# Patient Record
Sex: Male | Born: 1980 | Race: White | Hispanic: No | Marital: Married | Smoking: Current every day smoker
Health system: Southern US, Community
[De-identification: ages and names within clinical notes are randomized; demographics above are authoritative.]

## PROBLEM LIST (undated history)

## (undated) DIAGNOSIS — F4310 Post-traumatic stress disorder, unspecified: Secondary | ICD-10-CM

## (undated) DIAGNOSIS — R000 Tachycardia, unspecified: Secondary | ICD-10-CM

## (undated) DIAGNOSIS — M549 Dorsalgia, unspecified: Secondary | ICD-10-CM

## (undated) HISTORY — PX: TONSILLECTOMY: SUR1361

## (undated) HISTORY — PX: APPENDECTOMY: SHX54

---

## 2011-09-24 ENCOUNTER — Emergency Department (HOSPITAL_COMMUNITY)

## 2011-09-24 ENCOUNTER — Other Ambulatory Visit: Payer: Self-pay

## 2011-09-24 ENCOUNTER — Encounter (HOSPITAL_COMMUNITY): Payer: Self-pay | Admitting: *Deleted

## 2011-09-24 ENCOUNTER — Emergency Department (HOSPITAL_COMMUNITY)
Admission: EM | Admit: 2011-09-24 | Discharge: 2011-09-25 | Disposition: A | Discharge status: Home / Self Care | Attending: Emergency Medicine | Admitting: Emergency Medicine

## 2011-09-24 DIAGNOSIS — R079 Chest pain, unspecified: Secondary | ICD-10-CM | POA: Insufficient documentation

## 2011-09-24 DIAGNOSIS — R0602 Shortness of breath: Secondary | ICD-10-CM | POA: Insufficient documentation

## 2011-09-24 DIAGNOSIS — F17200 Nicotine dependence, unspecified, uncomplicated: Secondary | ICD-10-CM | POA: Insufficient documentation

## 2011-09-24 DIAGNOSIS — R110 Nausea: Secondary | ICD-10-CM | POA: Insufficient documentation

## 2011-09-24 HISTORY — DX: Post-traumatic stress disorder, unspecified: F43.10

## 2011-09-24 HISTORY — DX: Dorsalgia, unspecified: M54.9

## 2011-09-24 HISTORY — DX: Tachycardia, unspecified: R00.0

## 2011-09-24 MED ORDER — LORAZEPAM 1 MG PO TABS
1.0000 mg | ORAL_TABLET | Freq: Once | ORAL | Status: AC
  Administered 2011-09-25: 1 mg via ORAL
  Filled 2011-09-24: qty 1

## 2011-09-24 MED ORDER — ASPIRIN 325 MG PO TABS
325.0000 mg | ORAL_TABLET | Freq: Once | ORAL | Status: AC
  Administered 2011-09-25: 325 mg via ORAL
  Filled 2011-09-24: qty 1

## 2011-09-24 MED ORDER — MORPHINE SULFATE 4 MG/ML IJ SOLN
4.0000 mg | Freq: Once | INTRAMUSCULAR | Status: AC
  Administered 2011-09-25: 4 mg via INTRAVENOUS
  Filled 2011-09-24: qty 1

## 2011-09-24 NOTE — ED Notes (Signed)
Patient transported to X-ray 

## 2011-09-24 NOTE — ED Notes (Signed)
Received pt. From check in with C/O chest pain, pt. Had 1 episode last night, this episode started approx. 2 hours ago

## 2011-09-25 LAB — POCT I-STAT TROPONIN I
Troponin i, poc: 0 ng/mL (ref 0.00–0.08)
Troponin i, poc: 0 ng/mL (ref 0.00–0.08)

## 2011-09-25 LAB — DIFFERENTIAL
Basophils Absolute: 0 10*3/uL (ref 0.0–0.1)
Basophils Relative: 1 % (ref 0–1)
Eosinophils Relative: 2 % (ref 0–5)
Monocytes Absolute: 0.9 10*3/uL (ref 0.1–1.0)

## 2011-09-25 LAB — COMPREHENSIVE METABOLIC PANEL
AST: 30 U/L (ref 0–37)
Albumin: 3.6 g/dL (ref 3.5–5.2)
Calcium: 8.9 mg/dL (ref 8.4–10.5)
Creatinine, Ser: 0.93 mg/dL (ref 0.50–1.35)
GFR calc non Af Amer: >90 mL/min (ref >90)

## 2011-09-25 LAB — D-DIMER, QUANTITATIVE: D-Dimer, Quant: 0.26 ug/mL-FEU (ref 0.00–0.48)

## 2011-09-25 LAB — CBC
HCT: 40.2 % (ref 39.0–52.0)
MCH: 29.3 pg (ref 26.0–34.0)
MCHC: 34.3 g/dL (ref 30.0–36.0)
MCV: 85.4 fL (ref 78.0–100.0)
RDW: 13.9 % (ref 11.5–15.5)

## 2011-09-25 LAB — LIPASE, BLOOD: Lipase: 31 U/L (ref 11–59)

## 2011-09-25 MED ORDER — OXYCODONE-ACETAMINOPHEN 5-325 MG PO TABS
2.0000 | ORAL_TABLET | Freq: Once | ORAL | Status: AC
  Administered 2011-09-25: 2 via ORAL
  Filled 2011-09-25: qty 2

## 2011-09-25 MED ORDER — OXYCODONE-ACETAMINOPHEN 5-325 MG PO TABS: 2.0000 | ORAL_TABLET | Freq: Once | ORAL | Status: DC

## 2011-09-25 MED ORDER — TRAMADOL HCL 50 MG PO TABS: 50.0000 mg | ORAL_TABLET | Freq: Four times a day (QID) | ORAL | Status: AC | PRN

## 2011-09-25 NOTE — ED Provider Notes (Signed)
History     CSN: [id number]  Arrival date & time 09/24/11  2222   First MD Initiated Contact with Patient 09/24/11 2301      Chief Complaint  Patient presents with  . Chest Pain    (Consider location/radiation/quality/duration/timing/severity/associated sxs/prior treatment) HPI Patient is a 31 yo M who presents complaining of 10/10 chest pain as well as significant stress and chronic abdominal pain.  Patient has complicated abdominal pain history with report of perforated appendicitis 1 year prior with peritonitis requiring additional emergent laparotomy one year previously.  Patient also reports history of some cardiac issues which he has limited knowledge of.  He has not followed up with a cardiologist in [place] and he has not been in the state very long since moving from [place].  He endorses left sided, sharp and constant sensation for a day with associated nausea and shortness of breath without fever or cough.  He has no history of DVT or PE and has no family history of this.  He does report history of HTN, HLD and family history of CAD.  There are no other associated or modifying factors. .  Past Medical History  Diagnosis Date  . Tachycardia   . Post traumatic stress disorder (PTSD)   . Back pain     Past Surgical History  Procedure Date  . Appendectomy   . Tonsillectomy     History reviewed. No pertinent family history.  History  Substance Use Topics  . Smoking status: Current Everyday Smoker -- 0.5 packs/day  . Smokeless tobacco: Not on file  . Alcohol Use: Yes      Review of Systems  Constitutional: Negative.   HENT: Negative.   Eyes: Negative.   Respiratory: Positive for shortness of breath.   Cardiovascular: Positive for chest pain.  Gastrointestinal: Positive for nausea.  Genitourinary: Negative.   Musculoskeletal: Negative.   Skin: Negative.   Neurological: Negative.   Hematological: Negative.   Psychiatric/Behavioral: Negative.   All other systems  reviewed and are negative.    Allergies  Reglan  Home Medications   Current Outpatient Rx  Name Route Sig Dispense Refill  . ALPRAZOLAM 1 MG PO TABS Oral Take 1 mg by mouth 3 (three) times daily as needed. For anxiety    . ATENOLOL 25 MG PO TABS Oral Take 25 mg by mouth daily.    Marland Kitchen ESOMEPRAZOLE MAGNESIUM 40 MG PO CPDR Oral Take 40 mg by mouth daily before breakfast.    . TRAMADOL HCL 50 MG PO TABS Oral Take 1 tablet (50 mg total) by mouth every 6 (six) hours as needed for pain. 15 tablet 0    BP 100/60  Pulse 80  Temp(Src) 98.1 F (36.7 C) (Oral)  Resp 16  Ht 5\' 7"  (1.702 m)  Wt 215 lb (97.523 kg)  BMI 33.67 kg/m2  SpO2 98%  Physical Exam  Nursing note and vitals reviewed. GEN: Well-developed, well-nourished male in no distress HEENT: Atraumatic, normocephalic. Oropharynx clear without erythema EYES: PERRLA BL, no scleral icterus. NECK: Trachea midline, no meningismus CV: regular rate and rhythm. No murmurs, rubs, or gallops PULM: No respiratory distress.  No crackles, wheezes, or rales. GI: soft, non-tender. No guarding, rebound, or tenderness. + bowel sounds  GU: deferred Neuro: cranial nerves 2-12 intact, no abnormalities of strength or sensation, A and O x 3 MSK: Patient moves all 4 extremities symmetrically, no deformity, edema, or injury noted Skin: No rashes petechiae, purpura, or jaundice Psych: anxious   ED Course  Procedures (including critical care time)   Date: 09/25/2011  Rate: 91  Rhythm: normal sinus rhythm  QRS Axis: normal  Intervals: normal  ST/T Wave abnormalities: normal  Conduction Disutrbances: none  Narrative Interpretation: unremarkable    Labs Reviewed  COMPREHENSIVE METABOLIC PANEL - Abnormal; Notable for the following:    Total Bilirubin 0.2 (*)    All other components within normal limits  CBC  DIFFERENTIAL  D-DIMER, QUANTITATIVE  LIPASE, BLOOD  POCT I-STAT TROPONIN I  POCT I-STAT TROPONIN I   Dg Chest 2 View  09/25/2011   *RADIOLOGY REPORT*  Clinical Data: Chest pain.  CHEST - 2 VIEW  Comparison: None.  Findings: The lungs are well-aerated and clear.  There is no evidence of focal opacification, pleural effusion or pneumothorax.  The heart is borderline normal in size; the mediastinal contour is within normal limits.  No acute osseous abnormalities are seen.  IMPRESSION: No acute cardiopulmonary process seen.  Original Report Authenticated By: Tonia Ghent, M.D.     1. Chest pain       MDM  Patient had cardiac work-up given his complaints and risk factors though I had relatively low suspicion for ACS.  TNI was negative at 0 and 3 hours and patient was given ASA here as well as pain medications with improvement of his symptoms.  ECG, CXR, CBC, CMP, lipase, and ddimer were also all completely normal.  Patient was discharged in good condition with instructions to follow-up with his PCP if his symptoms persist.      Cyndra Numbers, MD 09/27/11 [phone]

## 2011-09-25 NOTE — Discharge Instructions (Signed)
Chest Pain (Nonspecific) It is often hard to give a specific diagnosis for the cause of chest pain. There is always a chance that your pain could be related to something serious, such as a heart attack or a blood clot in the lungs. You need to follow up with your caregiver for further evaluation. CAUSES   Heartburn.   Pneumonia or bronchitis.   Anxiety or stress.   Inflammation around your heart (pericarditis) or lung (pleuritis or pleurisy).   A blood clot in the lung.   A collapsed lung (pneumothorax). It can develop suddenly on its own (spontaneous pneumothorax) or from injury (trauma) to the chest.   Shingles infection (herpes zoster virus).  The chest wall is composed of bones, muscles, and cartilage. Any of these can be the source of the pain.  The bones can be bruised by injury.   The muscles or cartilage can be strained by coughing or overwork.   The cartilage can be affected by inflammation and become sore (costochondritis).  DIAGNOSIS  Lab tests or other studies, such as X-rays, electrocardiography, stress testing, or cardiac imaging, may be needed to find the cause of your pain.  TREATMENT   Treatment depends on what may be causing your chest pain. Treatment may include:   Acid blockers for heartburn.   Anti-inflammatory medicine.   Pain medicine for inflammatory conditions.   Antibiotics if an infection is present.   You may be advised to change lifestyle habits. This includes stopping smoking and avoiding alcohol, caffeine, and chocolate.   You may be advised to keep your head raised (elevated) when sleeping. This reduces the chance of acid going backward from your stomach into your esophagus.   Most of the time, nonspecific chest pain will improve within 2 to 3 days with rest and mild pain medicine.  HOME CARE INSTRUCTIONS   If antibiotics were prescribed, take your antibiotics as directed. Finish them even if you start to feel better.   For the next few  days, avoid physical activities that bring on chest pain. Continue physical activities as directed.   Do not smoke.   Avoid drinking alcohol.   Only take over-the-counter or prescription medicine for pain, discomfort, or fever as directed by your caregiver.   Follow your caregiver's suggestions for further testing if your chest pain does not go away.   Keep any follow-up appointments you made. If you do not go to an appointment, you could develop lasting (chronic) problems with pain. If there is any problem keeping an appointment, you must call to reschedule.  SEEK MEDICAL CARE IF:   You think you are having problems from the medicine you are taking. Read your medicine instructions carefully.   Your chest pain does not go away, even after treatment.   You develop a rash with blisters on your chest.  SEEK IMMEDIATE MEDICAL CARE IF:   You have increased chest pain or pain that spreads to your arm, neck, jaw, back, or abdomen.   You develop shortness of breath, an increasing cough, or you are coughing up blood.   You have severe back or abdominal pain, feel nauseous, or vomit.   You develop severe weakness, fainting, or chills.   You have a fever.  THIS IS AN EMERGENCY. Do not wait to see if the pain will go away. Get medical help at once. Call your local emergency services (911 in [place] Do not drive yourself to the hospital. MAKE SURE YOU:   Understand these instructions.     Will watch your condition.   Will get help right away if you are not doing well or get worse.  Document Released: 04/05/2005 Document Revised: 06/15/2011 Document Reviewed: 01/30/2008 [employer] Patient Information 2012 [employer]. 

## 2011-09-25 NOTE — ED Notes (Signed)
Pt. Ambulated around the unit, returned to room, SaO2 98% on around unit,

## 2011-09-25 NOTE — ED Notes (Signed)
Pt. Discharged to home via w/c with friend, NAD noted

## 2012-12-15 ENCOUNTER — Emergency Department (HOSPITAL_COMMUNITY): Payer: Self-pay

## 2012-12-15 ENCOUNTER — Emergency Department (HOSPITAL_COMMUNITY)
Admission: EM | Admit: 2012-12-15 | Discharge: 2012-12-16 | Disposition: A | Discharge status: Home / Self Care | Payer: Self-pay | Attending: Emergency Medicine | Admitting: Emergency Medicine

## 2012-12-15 ENCOUNTER — Encounter (HOSPITAL_COMMUNITY): Payer: Self-pay | Admitting: *Deleted

## 2012-12-15 DIAGNOSIS — Z8739 Personal history of other diseases of the musculoskeletal system and connective tissue: Secondary | ICD-10-CM | POA: Insufficient documentation

## 2012-12-15 DIAGNOSIS — F17200 Nicotine dependence, unspecified, uncomplicated: Secondary | ICD-10-CM | POA: Insufficient documentation

## 2012-12-15 DIAGNOSIS — Z79899 Other long term (current) drug therapy: Secondary | ICD-10-CM | POA: Insufficient documentation

## 2012-12-15 DIAGNOSIS — R079 Chest pain, unspecified: Secondary | ICD-10-CM | POA: Insufficient documentation

## 2012-12-15 DIAGNOSIS — Z8659 Personal history of other mental and behavioral disorders: Secondary | ICD-10-CM | POA: Insufficient documentation

## 2012-12-15 DIAGNOSIS — R42 Dizziness and giddiness: Secondary | ICD-10-CM | POA: Insufficient documentation

## 2012-12-15 DIAGNOSIS — R000 Tachycardia, unspecified: Secondary | ICD-10-CM | POA: Insufficient documentation

## 2012-12-15 LAB — BASIC METABOLIC PANEL
Chloride: 102 mEq/L (ref 96–112)
GFR calc Af Amer: >90 mL/min (ref >90)
GFR calc non Af Amer: >90 mL/min (ref >90)
Glucose, Bld: 127 mg/dL — ABNORMAL HIGH (ref 70–99)
Potassium: 3.5 mEq/L (ref 3.5–5.1)
Sodium: 138 mEq/L (ref 135–145)

## 2012-12-15 LAB — CBC
HCT: 43.5 % (ref 39.0–52.0)
Hemoglobin: 14.9 g/dL (ref 13.0–17.0)
WBC: 8.9 10*3/uL (ref 4.0–10.5)

## 2012-12-15 LAB — POCT I-STAT TROPONIN I

## 2012-12-15 MED ORDER — KETOROLAC TROMETHAMINE 30 MG/ML IJ SOLN
30.0000 mg | Freq: Once | INTRAMUSCULAR | Status: AC
  Administered 2012-12-15: 30 mg via INTRAVENOUS
  Filled 2012-12-15: qty 1

## 2012-12-15 MED ORDER — NITROGLYCERIN 0.4 MG SL SUBL
0.4000 mg | SUBLINGUAL_TABLET | SUBLINGUAL | Status: DC | PRN
  Administered 2012-12-15: 0.4 mg via SUBLINGUAL
  Filled 2012-12-15: qty 25

## 2012-12-15 MED ORDER — MORPHINE SULFATE 4 MG/ML IJ SOLN
4.0000 mg | Freq: Once | INTRAMUSCULAR | Status: AC
  Administered 2012-12-15: 4 mg via INTRAVENOUS
  Filled 2012-12-15: qty 1

## 2012-12-15 MED ORDER — LORAZEPAM 2 MG/ML IJ SOLN
1.0000 mg | Freq: Once | INTRAMUSCULAR | Status: AC
  Administered 2012-12-15: 1 mg via INTRAVENOUS
  Filled 2012-12-15: qty 1

## 2012-12-15 NOTE — ED Provider Notes (Signed)
History     CSN: [id number]  Arrival date & time 12/15/12  2021   First MD Initiated Contact with Patient 12/15/12 2044      Chief Complaint  Patient presents with  . Chest Pain    left side    (Consider location/radiation/quality/duration/timing/severity/associated sxs/prior treatment) HPI Patient presents with chest pain and tachycardia. Symptoms began in the minutes prior to ED arrival. He states he was sitting at home, feeling particularly anxious and stressful about life events, when he felt the onset of left-sided superior sore chest pain.  The pain is sore and stabbing, nonradiating.  There is no associated pleuritic or exertional pain there is no lightheadedness, nausea, vomiting, syncope, unilateral weakness or dysesthesia. He states it is a history of similar event approximately one year ago. He has a formal diagnosis of tachycardia without clear etiology by the [facility]. He has a notable history of PTSD, chronic back pain as well. He takes medication regularly. No clear exacerbating or alleviating factors since onset.  Past Medical History  Diagnosis Date  . Tachycardia   . Post traumatic stress disorder (PTSD)   . Back pain     Past Surgical History  Procedure Laterality Date  . Appendectomy    . Tonsillectomy      No family history on file.  History  Substance Use Topics  . Smoking status: Current Every Day Smoker -- 0.50 packs/day  . Smokeless tobacco: Not on file  . Alcohol Use: Yes      Review of Systems  Constitutional:       Per HPI, otherwise negative  HENT:       Per HPI, otherwise negative  Respiratory:       Per HPI, otherwise negative  Cardiovascular:       Per HPI, otherwise negative  Gastrointestinal: Negative for vomiting.  Endocrine:       Negative aside from HPI  Genitourinary:       Neg aside from HPI   Musculoskeletal:       Per HPI, otherwise negative  Skin: Negative.   Neurological: Negative for  syncope.    Allergies  Metoclopramide hcl  Home Medications   Current Outpatient Rx  Name  Route  Sig  Dispense  Refill  . ALPRAZolam (XANAX) 1 MG tablet   Oral   Take 1 mg by mouth 2 (two) times daily as needed (for PTSD). For anxiety         . ARIPiprazole (ABILIFY) 2 MG tablet   Oral   Take 2 mg by mouth daily.         Marland Kitchen atenolol (TENORMIN) 50 MG tablet   Oral   Take 50 mg by mouth daily.         Marland Kitchen omeprazole (PRILOSEC) 20 MG capsule   Oral   Take 20 mg by mouth daily.         Marland Kitchen venlafaxine XR (EFFEXOR-XR) 150 MG 24 hr capsule   Oral   Take 150 mg by mouth daily.           BP 132/83  Pulse 123  Temp(Src) 98.9 F (37.2 C) (Oral)  Resp 20  SpO2 98%  Physical Exam  Nursing note and vitals reviewed. Constitutional: He is oriented to person, place, and time. He appears well-developed. No distress.  HENT:  Head: Normocephalic and atraumatic.  Eyes: Conjunctivae and EOM are normal.  Cardiovascular: Regular rhythm.  Tachycardia present.   Pulmonary/Chest: Effort normal. No stridor. No respiratory  distress.  Abdominal: He exhibits no distension.  Musculoskeletal: He exhibits no edema.  Neurological: He is alert and oriented to person, place, and time.  Skin: Skin is warm and dry.  Psychiatric: His mood appears anxious.    ED Course  Procedures (including critical care time)  Labs Reviewed  BASIC METABOLIC PANEL - Abnormal; Notable for the following:    Glucose, Bld 127 (*)    All other components within normal limits  CBC  POCT I-STAT TROPONIN I   Dg Chest Port 1 View  12/15/2012   *RADIOLOGY REPORT*  Clinical Data: Chest pain and tachycardia.  PORTABLE CHEST - 1 VIEW  Comparison: 09/24/2011  Findings: Lung volumes are very low.  No edema, infiltrate or pleural fluid is seen.  Heart size and mediastinal contours are within normal limits.  Visualized bony structures are unremarkable.  IMPRESSION: Low lung volumes.  No active disease.   Original  Report Authenticated By: Irish Lack, M.D.     No diagnosis found.  Cardiac 121 sinus rhythm normal Pulse ox 99% room air normal   Date: 12/15/2012  Rate: 126  Rhythm: sinus tach  QRS Axis: normal  Intervals: normal  ST/T Wave abnormalities: normal  Conduction Disutrbances:none  Narrative Interpretation:   Old EKG Reviewed: none available ABNORMAL  11:19 PM PAtient denies current pain.  We discussed F/U, return precautions. MDM  Patient presents with new chest pain.  Notably, the patient is young, has one prior similar evaluation was unremarkable including serial troponins, insidiously stress test that was unremarkable within the past year.  The patient is notably anxious, tachycardic, there is low suspicion for ACS given the reassuring evaluation here.    Gerhard Munch, MD 12/15/12 [phone]

## 2012-12-15 NOTE — ED Notes (Addendum)
15 min PTA sitting with onset of chest pain developed, lightheaded, states he has had this before and dx with tachycardia. 325mg  ASA PTA

## 2014-09-07 IMAGING — CR DG CHEST 1V PORT
1 series · 1 of 1 positions shown · non-contrast
Comparison: 09/24/2011

CLINICAL DATA: Chest pain and tachycardia.

PORTABLE CHEST - 1 VIEW

[AP]
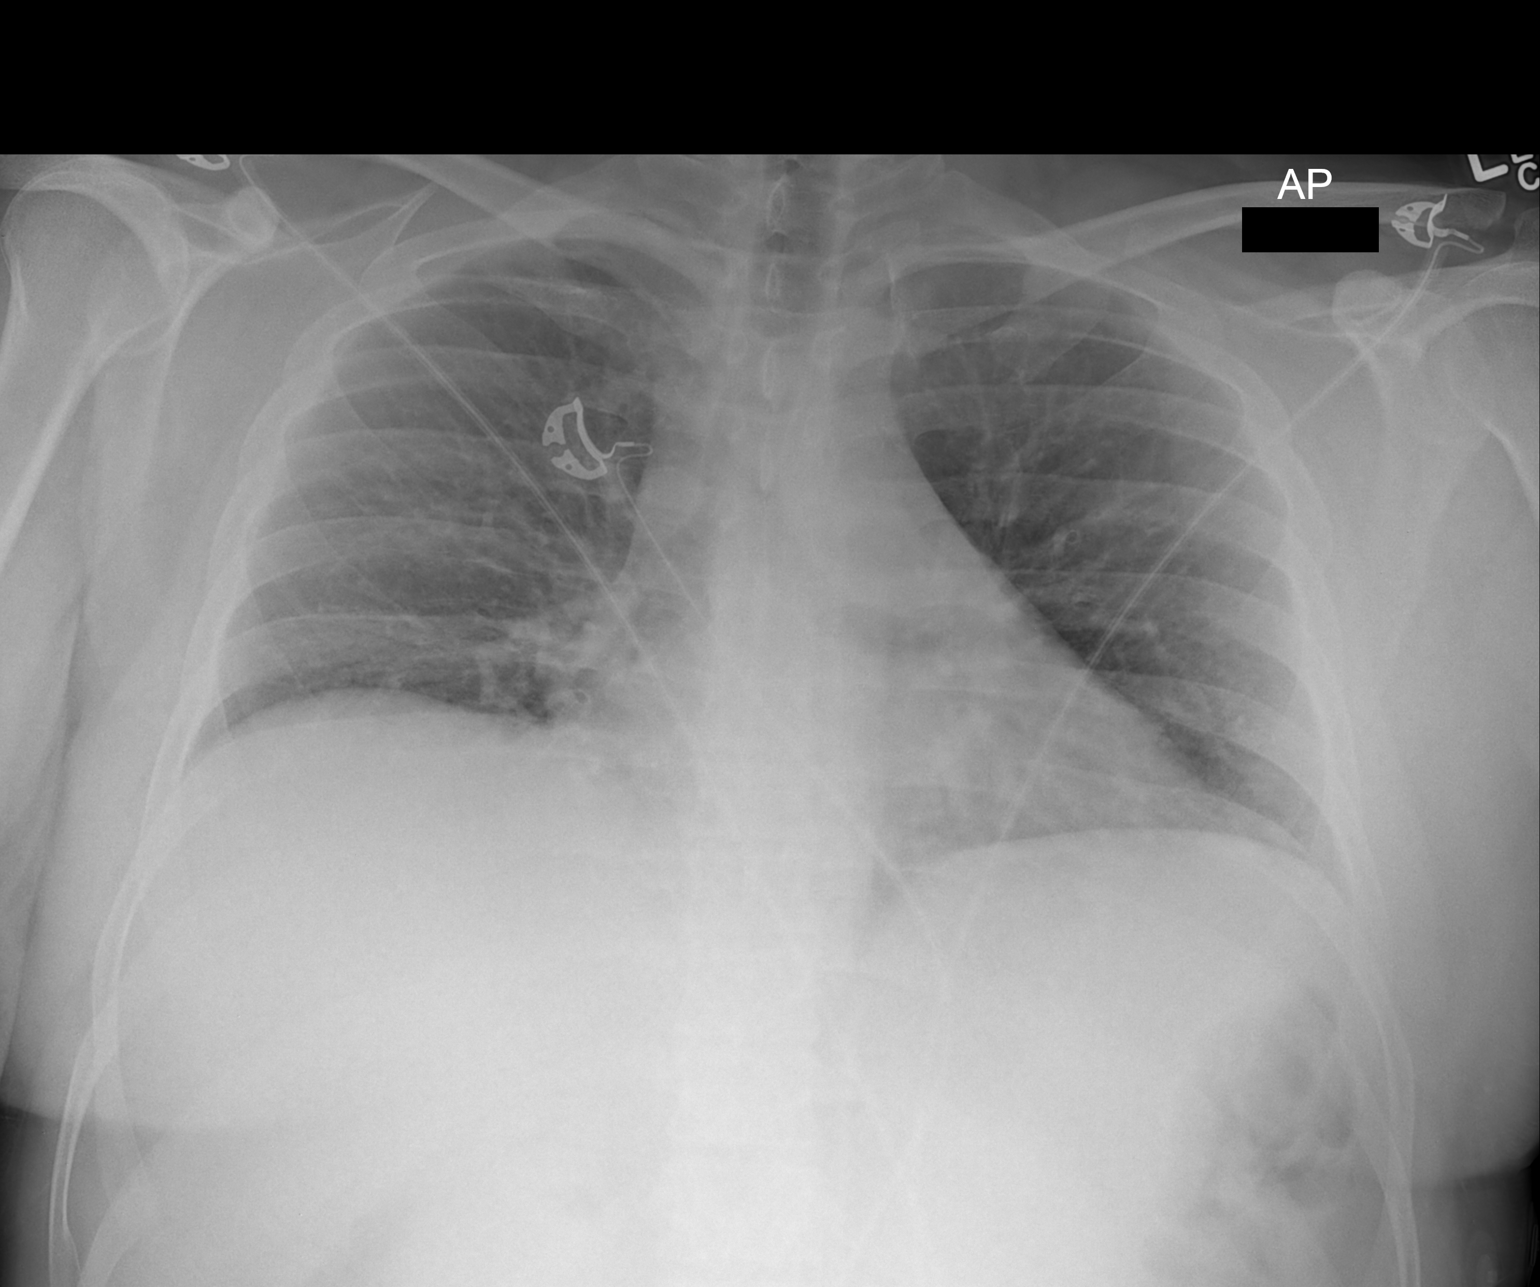

[1 of 1 positions shown; findings below may reference images not displayed]

FINDINGS: Lung volumes are very low.  No edema, infiltrate or
pleural fluid is seen.  Heart size and mediastinal contours are
within normal limits.  Visualized bony structures are unremarkable.
IMPRESSION: Low lung volumes.  No active disease.

## 2023-01-24 ENCOUNTER — Ambulatory Visit
Admission: EM | Admit: 2023-01-24 | Discharge: 2023-01-24 | Disposition: A | Discharge status: Home / Self Care | Payer: No Typology Code available for payment source

## 2023-01-24 DIAGNOSIS — R519 Headache, unspecified: Secondary | ICD-10-CM

## 2023-01-24 NOTE — Discharge Instructions (Addendum)
Given your persistent, acute on set headache and no response to Toradol yesterday, I recommend you go directly to the ER for further evaluation.

## 2023-01-24 NOTE — ED Provider Notes (Signed)
[facility]  Note:  This document was prepared using Dragon voice recognition software and may include unintentional dictation errors.  MRN: [id number] DOB: 06-15-1981 DATE: 01/24/23   Subjective:  Chief Complaint:  Chief Complaint  Patient presents with   Headache     HPI: Adam Saunders is a 42 y.o. male presenting for headache for the past 3 days.  Patient states on Monday night he was in the shower when he suddenly had a sharp pain in the back of his head.  He states he felt as if he had been hit with a bat in the back of his head.  He began feeling as if he was going to blackout.  He reports dizziness and blurred vision at that time.  He states he was able to get out of the shower and the pain subsided slightly at that time.  The next day he went to the [place] [facility] and was given 30 mg of Toradol IM per his chart.  He reports he did not get any relief from the injection.  He states his headache is now at the front of his head as opposed to the back of his head.  He has never had a headache to this degree.  He reports some nausea, but no vomiting.  No recent vision changes except for when the headache initially occurred.  He has taken Tylenol and NyQuil with no relief.  His last dose of Tylenol was 4 hours ago.  Denies fever, vomiting, slurred speech, loss of vision, facial drooping. Endorses headache, nausea, dizziness. Presents NAD.  Prior to Admission medications   Medication Sig Start Date End Date Taking? Authorizing Provider  buPROPion (WELLBUTRIN XL) 300 MG 24 hr tablet Take by mouth. 05/24/22  Yes [provider]  busPIRone (BUSPAR) 15 MG tablet Take by mouth. 02/22/22  Yes [provider]  lamoTRIgine (LAMICTAL) 100 MG tablet Take by mouth. 07/12/22  Yes [provider]  lurasidone (LATUDA) 40 MG TABS tablet Take by mouth. 12/26/22  Yes [provider]  omeprazole (PRILOSEC) 40 MG capsule Take by mouth. 11/02/22  Yes [provider]   propranolol (INDERAL) 20 MG tablet Take by mouth. 11/02/22  Yes [provider]  ALPRAZolam (XANAX) 1 MG tablet Take 1 mg by mouth 2 (two) times daily as needed (for PTSD). For anxiety    [provider]  ARIPiprazole (ABILIFY) 2 MG tablet Take 2 mg by mouth daily.    [provider]  atenolol (TENORMIN) 50 MG tablet Take 50 mg by mouth daily.    [provider]  ATENOLOL PO Take by mouth.    [provider]  omeprazole (PRILOSEC) 20 MG capsule Take 20 mg by mouth daily.    [provider]  venlafaxine XR (EFFEXOR-XR) 150 MG 24 hr capsule Take 150 mg by mouth daily.    [provider]     Allergies  Allergen Reactions   Metoclopramide Hcl Rash and Other (See Comments)    (Reglan) Feels like ants running through his body    History:   Past Medical History:  Diagnosis Date   Back pain    Post traumatic stress disorder (PTSD)    Tachycardia      Past Surgical History:  Procedure Laterality Date   APPENDECTOMY     TONSILLECTOMY      History reviewed. No pertinent family history.  Social History   Tobacco Use   Smoking status: Every Day    Current packs/day:  0.50    Types: Cigarettes  Substance Use Topics   Alcohol use: Yes   Drug use: No    Review of Systems  Constitutional:  Negative for fever.  Eyes:  Positive for photophobia and visual disturbance.  Gastrointestinal:  Positive for nausea. Negative for vomiting.  Neurological:  Positive for light-headedness and headaches. Negative for syncope, facial asymmetry and numbness.     Objective:   Vitals: BP 111/71 (BP Location: Right Arm)   Pulse 80   Temp 99 F (37.2 C) (Oral)   Resp 16   SpO2 97%   Physical Exam Constitutional:      General: He is not in acute distress.    Appearance: Normal appearance. He is well-developed and normal weight. He is not ill-appearing or toxic-appearing.  HENT:     Head: Normocephalic and atraumatic.  Eyes:      Extraocular Movements: Extraocular movements intact.     Pupils: Pupils are equal, round, and reactive to light.  Neck:     Meningeal: Brudzinski's sign absent.  Cardiovascular:     Rate and Rhythm: Normal rate and regular rhythm.     Heart sounds: Normal heart sounds.  Pulmonary:     Effort: Pulmonary effort is normal.     Breath sounds: Normal breath sounds.     Comments: Clear to auscultation bilaterally  Abdominal:     General: Bowel sounds are normal.     Palpations: Abdomen is soft.     Tenderness: There is no abdominal tenderness.  Skin:    General: Skin is warm and dry.  Neurological:     General: No focal deficit present.     Mental Status: He is alert.     Cranial Nerves: Cranial nerves 2-12 are intact.  Psychiatric:        Mood and Affect: Mood and affect normal.     Results:  Labs: No results found for this or any previous visit (from the past 24 hour(s)).  Radiology: No results found.   UC Course/Treatments:  Procedures: Procedures   Medications Ordered in UC: Medications - No data to display   Assessment and Plan :     ICD-10-CM   1. Acute intractable headache, unspecified headache type  R51.9      Acute intractable headache, unspecified headache type Patient presented to urgent care complaining of intractable headache for 3 days.   On exam, patient reported sudden onset headache unlike any headache he has had before. Reports no relief OTC medication or Toradol IM.   Given sudden onset and no response to IM toradol or OTC medications, it is recommend that patient go directly to the ER for further evaluation/treatment/observation. Concern for possible aneurysm versus bleed.   Patient is stable for discharge to the emergency department. he will be going to [facility] ER via private vehicle driven by wife.  ED Discharge Orders     None        I have reviewed the PDMP during this encounter.     Cynda Acres, PA-C 01/24/23 2013

## 2023-01-24 NOTE — ED Notes (Signed)
Patient is being discharged from the Urgent Care and sent to the Emergency Department via POV with wife driving . Per [employer], PA, patient is in need of higher level of care due to need for further evaluation. Patient is aware and verbalizes understanding of plan of care.  Vitals:   01/24/23 1950  BP: 111/71  Pulse: 80  Resp: 16  Temp: 99 F (37.2 C)  SpO2: 97%

## 2023-01-24 NOTE — ED Triage Notes (Addendum)
Pt c/o Monday night in shower instant pain in the back of head- felt like a ball hit the back of his head, blacked out, dizziness and blurred vision at onset. Went to the [place] [facility] yesterday given Toradol, it has not resolved but has no worsened. Can't sleep Pain is currently at the top front of head that feels like severe sinus pressure and c/o N Two Tylenol around 5pm today.  Pain 8/10 Dizziness and blurred vision at initial onset.

## 2023-02-03 ENCOUNTER — Ambulatory Visit
Admission: EM | Admit: 2023-02-03 | Discharge: 2023-02-03 | Disposition: A | Discharge status: Home / Self Care | Payer: No Typology Code available for payment source | Attending: Internal Medicine | Admitting: Internal Medicine

## 2023-02-03 DIAGNOSIS — T63441A Toxic effect of venom of bees, accidental (unintentional), initial encounter: Secondary | ICD-10-CM

## 2023-02-03 MED ORDER — CETIRIZINE HCL 10 MG PO TABS: 10.0000 mg | ORAL_TABLET | Freq: Every day | ORAL | 0 refills | Status: AC | PRN

## 2023-02-03 MED ORDER — TRIAMCINOLONE ACETONIDE 0.1 % EX CREA: 1.0000 | TOPICAL_CREAM | Freq: Two times a day (BID) | CUTANEOUS | 0 refills | Status: AC | PRN

## 2023-02-03 MED ORDER — DIPHENHYDRAMINE HCL 50 MG/ML IJ SOLN
25.0000 mg | Freq: Once | INTRAMUSCULAR | Status: AC
  Administered 2023-02-03: 25 mg via INTRAMUSCULAR

## 2023-02-03 MED ORDER — METHYLPREDNISOLONE ACETATE 80 MG/ML IJ SUSP
40.0000 mg | Freq: Once | INTRAMUSCULAR | Status: AC
  Administered 2023-02-03: 40 mg via INTRAMUSCULAR

## 2023-02-03 NOTE — ED Provider Notes (Addendum)
[facility]  Note:  This document was prepared using Dragon voice recognition software and may include unintentional dictation errors.  MRN: [id number] DOB: 07/06/1981 DATE: 02/03/23   Subjective:  Chief Complaint:  Chief Complaint  Patient presents with   Insect Bite    HPI: Adam Saunders is a 42 y.o. male presenting for multiple bee stings about an hour ago. Patient states he was mowing the grass and went through what he believes was a swarm of bees. He states he believes he was stung about 5 times. Reports that he immediately felt a burning sensation throughout his body. He ran inside and started rubbing himself off with a credit card. He became concerned because the burning sensation continued throughout his body and he called 911. EMS recommended to start benadryl on him and take him to the ER, but patient refused stating he would go to urgent care. Reports continued burning sensation throughout body, but to a lesser degree. Reports piercing sensation at the stings. He has not taken anything for his symptoms. Denies fever, nausea/vomiting, shortness of breath, chest pain, difficulty breathing, facial swelling. Endorses bee stings. Presents NAD.  Prior to Admission medications   Medication Sig Start Date End Date Taking? Authorizing Provider  cetirizine (ZYRTEC) 10 MG tablet Take 1 tablet (10 mg total) by mouth daily as needed. 02/03/23  Yes Helga Asbury P, PA-C  triamcinolone cream (KENALOG) 0.1 % Apply 1 Application topically 2 (two) times daily as needed for up to 10 days. 02/03/23 02/13/23 Yes Barnard Sharps P, PA-C  ALPRAZolam (XANAX) 1 MG tablet Take 1 mg by mouth 2 (two) times daily as needed (for PTSD). For anxiety    [provider]  ARIPiprazole (ABILIFY) 2 MG tablet Take 2 mg by mouth daily.    [provider]  atenolol (TENORMIN) 50 MG tablet Take 50 mg by mouth daily.    [provider]  ATENOLOL PO Take by mouth.    [provider]  buPROPion (WELLBUTRIN XL) 300 MG 24 hr tablet Take by mouth. 05/24/22   [provider]  busPIRone (BUSPAR) 15 MG tablet Take by mouth. 02/22/22   [provider]  lamoTRIgine (LAMICTAL) 100 MG tablet Take by mouth. 07/12/22   [provider]  lurasidone (LATUDA) 40 MG TABS tablet Take by mouth. 12/26/22   [provider]  omeprazole (PRILOSEC) 20 MG capsule Take 20 mg by mouth daily.    [provider]  omeprazole (PRILOSEC) 40 MG capsule Take by mouth. 11/02/22   [provider]  propranolol (INDERAL) 20 MG tablet Take by mouth. 11/02/22   [provider]  venlafaxine XR (EFFEXOR-XR) 150 MG 24 hr capsule Take 150 mg by mouth daily.    [provider]     Allergies  Allergen Reactions   Metoclopramide Hcl Rash and Other (See Comments)    (Reglan) Feels like ants running through his body    History:   Past Medical History:  Diagnosis Date   Back pain    Post traumatic stress disorder (PTSD)    Tachycardia      Past Surgical History:  Procedure Laterality Date   APPENDECTOMY     TONSILLECTOMY      History reviewed. No pertinent family history.  Social History   Tobacco Use   Smoking status: Every Day    Current packs/day: 0.50    Types: Cigarettes  Substance Use Topics   Alcohol use: Yes   Drug use: No  Review of Systems  Constitutional:  Negative for fever.  Respiratory:  Negative for chest tightness, shortness of breath and wheezing.   Cardiovascular:  Negative for chest pain.  Gastrointestinal:  Negative for nausea and vomiting.  Skin:  Positive for rash.  Psychiatric/Behavioral:  The patient is nervous/anxious.      Objective:   Vitals: BP 110/75 (BP Location: Right Arm)   Pulse (!) 118   Temp 98.5 F (36.9 C) (Oral)   Resp 18   SpO2 98%   Physical Exam Constitutional:      General: He is not in acute distress.    Appearance: Normal appearance. He is well-developed and normal  weight. He is not ill-appearing or toxic-appearing.  HENT:     Head: Normocephalic and atraumatic.     Mouth/Throat:     Comments: Airway patent Cardiovascular:     Rate and Rhythm: Regular rhythm. Tachycardia present.     Heart sounds: Normal heart sounds.  Pulmonary:     Effort: Pulmonary effort is normal.     Breath sounds: Normal breath sounds.     Comments: Clear to auscultation bilaterally  Abdominal:     General: Bowel sounds are normal.     Palpations: Abdomen is soft.     Tenderness: There is no abdominal tenderness.  Skin:    General: Skin is warm and dry.     Findings: Lesion and rash present. Rash is macular and papular.          Comments: Patient has several pink raised maculopapular lesions on his left arm and bilateral lower extremities. Punctuated center where he was stung. Warmth and excoriations present. No discharge.   Neurological:     General: No focal deficit present.     Mental Status: He is alert.  Psychiatric:        Mood and Affect: Affect normal. Mood is anxious.     Results:  Labs: No results found for this or any previous visit (from the past 24 hour(s)).  Radiology: No results found.   UC Course/Treatments:  Procedures: Procedures   Medications Ordered in UC: Medications  methylPREDNISolone acetate (DEPO-MEDROL) injection 40 mg (40 mg Intramuscular Given 02/03/23 1312)  diphenhydrAMINE (BENADRYL) injection 25 mg (25 mg Intramuscular Given 02/03/23 1310)     Assessment and Plan :     ICD-10-CM   1. Bee sting reaction, accidental or unintentional, initial encounter  T63.441A      Bee sting reaction, accidental or unintentional, initial encounter Afebrile, nontoxic-appearing, NAD. VSS. DDX includes but not limited to: localized reaction, anaphylaxis, shock Patient was anxious on presentation. Areas of warmth and redness at site of stings. Patient reported diffuse burning that had improved since being initially stung. Methylprednisolone  40mg  IM and Diphenhydramine 25mg  IM was given in office to help with reaction. Airway patent with no signs of respiratory distress. Family member was able to drive the patient home given diphenhydramine injection. Patient instructed to avoid xanax and sleep aids today. Triamcinolone 0.1% topical cream a application BID and zyrtec 10mg  QD was prescribed to help with localized reaction and pruritus at home. Strict ED precautions were given and patient verbalized understanding.   ED Discharge Orders          Ordered    triamcinolone cream (KENALOG) 0.1 %  2 times daily PRN        02/03/23 1313    cetirizine (ZYRTEC) 10 MG tablet  Daily PRN        02/03/23 1313  PDMP not reviewed this encounter.     Ac Colan P, PA-C 02/03/23 1333    Merek Niu P, PA-C 02/03/23 1334

## 2023-02-03 NOTE — Discharge Instructions (Addendum)
Recommend you continue with steroid cream and Zyrtec at home for pruritus.   You were given an injection (Methylprednisolone and Diphenhydramine) for your bee stings today in office. This will help with the reaction. The benadryl with make you sleepy, so avoid any sleep aids or Xanax today.   If you start feeling worse, shortness of breath, difficulty breathing, throat feeling as if it is closing, nausea/vomiting, or facial swelling, I recommend you call 911.

## 2023-02-03 NOTE — ED Triage Notes (Signed)
Pt c/o multiple bee stings today about an hour ago with about 3-4 stings and with pain, redness and swelling at site Denies: N/V/D, CP, SOB
